# Patient Record
Sex: Female | Born: 1940 | Race: White | Hispanic: No | Marital: Married | Smoking: Never smoker
Health system: Southern US, Community
[De-identification: ages and names within clinical notes are randomized; demographics above are authoritative.]

## PROBLEM LIST (undated history)

## (undated) DIAGNOSIS — F0390 Unspecified dementia without behavioral disturbance: Secondary | ICD-10-CM

---

## 2015-06-25 ENCOUNTER — Emergency Department
Admission: EM | Admit: 2015-06-25 | Discharge: 2015-06-27 | Disposition: A | Discharge status: Home / Self Care | Payer: Medicare Other | Attending: Emergency Medicine | Admitting: Emergency Medicine

## 2015-06-25 DIAGNOSIS — G8929 Other chronic pain: Secondary | ICD-10-CM

## 2015-06-25 DIAGNOSIS — F0392 Unspecified dementia, unspecified severity, with psychotic disturbance: Secondary | ICD-10-CM

## 2015-06-25 DIAGNOSIS — Z79899 Other long term (current) drug therapy: Secondary | ICD-10-CM | POA: Insufficient documentation

## 2015-06-25 DIAGNOSIS — F0391 Unspecified dementia with behavioral disturbance: Secondary | ICD-10-CM | POA: Insufficient documentation

## 2015-06-25 DIAGNOSIS — R4589 Other symptoms and signs involving emotional state: Secondary | ICD-10-CM

## 2015-06-25 LAB — CBC
HEMATOCRIT: 29.1 % — AB (ref 35.0–47.0)
Hemoglobin: 9.8 g/dL — ABNORMAL LOW (ref 12.0–16.0)
MCH: 32.3 pg (ref 26.0–34.0)
MCHC: 33.9 g/dL (ref 32.0–36.0)
MCV: 95.5 fL (ref 80.0–100.0)
Platelets: 337 10*3/uL (ref 150–440)
RBC: 3.04 MIL/uL — ABNORMAL LOW (ref 3.80–5.20)
RDW: 15 % — AB (ref 11.5–14.5)
WBC: 11.2 10*3/uL — ABNORMAL HIGH (ref 3.6–11.0)

## 2015-06-25 LAB — SALICYLATE LEVEL: Salicylate Lvl: <4 mg/dL (ref 2.8–30.0)

## 2015-06-25 LAB — COMPREHENSIVE METABOLIC PANEL
ALBUMIN: 3.8 g/dL (ref 3.5–5.0)
ALK PHOS: 74 U/L (ref 38–126)
ALT: 16 U/L (ref 14–54)
ANION GAP: 4 — AB (ref 5–15)
AST: 23 U/L (ref 15–41)
BILIRUBIN TOTAL: 0.5 mg/dL (ref 0.3–1.2)
BUN: 22 mg/dL — AB (ref 6–20)
CALCIUM: 8.8 mg/dL — AB (ref 8.9–10.3)
CO2: 27 mmol/L (ref 22–32)
CREATININE: 1.18 mg/dL — AB (ref 0.44–1.00)
Chloride: 106 mmol/L (ref 101–111)
GFR calc Af Amer: 51 mL/min — ABNORMAL LOW (ref >60)
GFR calc non Af Amer: 44 mL/min — ABNORMAL LOW (ref >60)
GLUCOSE: 118 mg/dL — AB (ref 65–99)
Potassium: 4.1 mmol/L (ref 3.5–5.1)
Sodium: 137 mmol/L (ref 135–145)
TOTAL PROTEIN: 6.8 g/dL (ref 6.5–8.1)

## 2015-06-25 LAB — ACETAMINOPHEN LEVEL

## 2015-06-25 LAB — ETHANOL: Alcohol, Ethyl (B): <5 mg/dL (ref <5)

## 2015-06-25 MED ORDER — IBUPROFEN 600 MG PO TABS
ORAL_TABLET | ORAL | Status: AC
  Administered 2015-06-25: 600 mg via ORAL
  Filled 2015-06-25: qty 1

## 2015-06-25 MED ORDER — IBUPROFEN 600 MG PO TABS
600.0000 mg | ORAL_TABLET | Freq: Once | ORAL | Status: AC
  Administered 2015-06-25: 600 mg via ORAL
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
## 2015-06-25 NOTE — ED Notes (Addendum)
Black tshirt, beige bra, jeans, black tennis shoes, white socks, white panties; removed and placed in labeled pt belonging bag to be secured on nursing unit; pt dressed in behav scrubs; pt is a retired Engineer, civil (consulting)nurse & pt voices good understanding of plan of care

## 2015-06-26 DIAGNOSIS — R4589 Other symptoms and signs involving emotional state: Secondary | ICD-10-CM

## 2015-06-26 DIAGNOSIS — F0391 Unspecified dementia with behavioral disturbance: Secondary | ICD-10-CM

## 2015-06-26 DIAGNOSIS — F0392 Unspecified dementia, unspecified severity, with psychotic disturbance: Secondary | ICD-10-CM

## 2015-06-26 DIAGNOSIS — G8929 Other chronic pain: Secondary | ICD-10-CM

## 2015-06-26 LAB — URINALYSIS COMPLETE WITH MICROSCOPIC (ARMC ONLY)
Bilirubin Urine: NEGATIVE
GLUCOSE, UA: NEGATIVE mg/dL
HGB URINE DIPSTICK: NEGATIVE
Ketones, ur: NEGATIVE mg/dL
Nitrite: NEGATIVE
PROTEIN: NEGATIVE mg/dL
SPECIFIC GRAVITY, URINE: 1.018 (ref 1.005–1.030)
pH: 5 (ref 5.0–8.0)

## 2015-06-26 LAB — URINE DRUG SCREEN, QUALITATIVE (ARMC ONLY)
Amphetamines, Ur Screen: NOT DETECTED
BARBITURATES, UR SCREEN: NOT DETECTED
Benzodiazepine, Ur Scrn: NOT DETECTED
CANNABINOID 50 NG, UR ~~LOC~~: NOT DETECTED
COCAINE METABOLITE, UR ~~LOC~~: NOT DETECTED
MDMA (Ecstasy)Ur Screen: NOT DETECTED
Methadone Scn, Ur: NOT DETECTED
OPIATE, UR SCREEN: POSITIVE — AB
Phencyclidine (PCP) Ur S: NOT DETECTED
Tricyclic, Ur Screen: POSITIVE — AB

## 2015-06-26 MED ORDER — PANTOPRAZOLE SODIUM 40 MG PO TBEC
40.0000 mg | DELAYED_RELEASE_TABLET | Freq: Every day | ORAL | Status: DC
  Administered 2015-06-26 – 2015-06-27 (×2): 40 mg via ORAL
  Filled 2015-06-26 (×2): qty 1

## 2015-06-26 MED ORDER — CALCIUM CARBONATE-VITAMIN D 500-200 MG-UNIT PO TABS
1.0000 | ORAL_TABLET | ORAL | Status: DC
  Administered 2015-06-27: 1 via ORAL
  Filled 2015-06-26 (×2): qty 1

## 2015-06-26 MED ORDER — ACETAMINOPHEN 325 MG PO TABS
650.0000 mg | ORAL_TABLET | Freq: Once | ORAL | Status: AC
  Administered 2015-06-26: 650 mg via ORAL
  Filled 2015-06-26: qty 2

## 2015-06-26 MED ORDER — HYDROMORPHONE HCL 2 MG PO TABS
4.0000 mg | ORAL_TABLET | Freq: Four times a day (QID) | ORAL | Status: DC | PRN
  Administered 2015-06-27 (×2): 4 mg via ORAL
  Filled 2015-06-26 (×3): qty 2

## 2015-06-26 MED ORDER — WARFARIN - PHYSICIAN DOSING INPATIENT
Freq: Every day | Status: DC
  Filled 2015-06-26 (×6): qty 1

## 2015-06-26 MED ORDER — VITAMIN D 1000 UNITS PO TABS
5000.0000 [IU] | ORAL_TABLET | Freq: Every day | ORAL | Status: DC
  Administered 2015-06-26 – 2015-06-27 (×2): 5000 [IU] via ORAL
  Filled 2015-06-26 (×2): qty 5

## 2015-06-26 MED ORDER — METOPROLOL SUCCINATE ER 50 MG PO TB24
50.0000 mg | ORAL_TABLET | ORAL | Status: DC
  Administered 2015-06-27: 50 mg via ORAL
  Filled 2015-06-26: qty 1

## 2015-06-26 MED ORDER — WARFARIN SODIUM 10 MG PO TABS
10.0000 mg | ORAL_TABLET | ORAL | Status: DC
  Filled 2015-06-26: qty 1

## 2015-06-26 MED ORDER — ROPINIROLE HCL 1 MG PO TABS
1.0000 mg | ORAL_TABLET | Freq: Two times a day (BID) | ORAL | Status: DC
  Administered 2015-06-26 – 2015-06-27 (×2): 1 mg via ORAL
  Filled 2015-06-26 (×3): qty 1

## 2015-06-26 MED ORDER — METHIMAZOLE 5 MG PO TABS
7.5000 mg | ORAL_TABLET | Freq: Every day | ORAL | Status: DC
  Administered 2015-06-26 – 2015-06-27 (×2): 7.5 mg via ORAL
  Filled 2015-06-26 (×2): qty 2

## 2015-06-26 MED ORDER — AMLODIPINE BESYLATE 5 MG PO TABS
5.0000 mg | ORAL_TABLET | Freq: Every day | ORAL | Status: DC
  Administered 2015-06-26 – 2015-06-27 (×2): 5 mg via ORAL
  Filled 2015-06-26 (×2): qty 1

## 2015-06-26 MED ORDER — TRAZODONE HCL 50 MG PO TABS
50.0000 mg | ORAL_TABLET | Freq: Every day | ORAL | Status: DC
  Administered 2015-06-26: 50 mg via ORAL
  Filled 2015-06-26: qty 1

## 2015-06-26 MED ORDER — SPIRONOLACTONE 25 MG PO TABS
50.0000 mg | ORAL_TABLET | Freq: Every day | ORAL | Status: DC
  Administered 2015-06-26 – 2015-06-27 (×2): 50 mg via ORAL
  Filled 2015-06-26 (×2): qty 2

## 2015-06-26 MED ORDER — SENNOSIDES-DOCUSATE SODIUM 8.6-50 MG PO TABS
1.0000 | ORAL_TABLET | Freq: Three times a day (TID) | ORAL | Status: DC
Start: 2015-06-26 — End: 2015-06-27
  Administered 2015-06-26 – 2015-06-27 (×2): 1 via ORAL
  Filled 2015-06-26 (×2): qty 1

## 2015-06-26 MED ORDER — WARFARIN SODIUM 5 MG PO TABS
5.0000 mg | ORAL_TABLET | Freq: Every day | ORAL | Status: DC
  Administered 2015-06-26: 5 mg via ORAL
  Filled 2015-06-26 (×2): qty 1

## 2015-06-26 MED ORDER — WARFARIN SODIUM 2 MG PO TABS
2.0000 mg | ORAL_TABLET | Freq: Every day | ORAL | Status: DC
  Administered 2015-06-26: 2 mg via ORAL
  Filled 2015-06-26 (×2): qty 1

## 2015-06-26 NOTE — ED Provider Notes (Signed)
-----------------------------------------   7:52 AM on 06/26/2015 -----------------------------------------   Blood pressure 158/111, pulse 85, temperature 98.2 F (36.8 C), temperature source Oral, resp. rate 20, height 5\' 3"  (1.6 m), weight 180 lb (81.647 kg), SpO2 97 %.  The patient had no acute events since last update.  Calm and cooperative at this time.  Disposition is pending per Psychiatry/Behavioral Medicine team recommendations.     Jennye MoccasinBrian S Quigley, MD 06/26/15 1213

## 2015-06-26 NOTE — ED Notes (Signed)
Patient in bathroom at this time.

## 2015-06-26 NOTE — ED Notes (Signed)
Pt skin tear to right forearm while on toilet; wrapped and covered with gauze. Bilateral protection to forearms.

## 2015-06-26 NOTE — ED Notes (Signed)
I have observed walk to and from the BR  NAD assessed  I can hear her telling the tech  "I walk just fine - you do not have to help me."     Tech provided walker to her at this time  - pt ambulates well with the walker

## 2015-06-26 NOTE — ED Notes (Signed)
ED BHU PLACEMENT JUSTIFICATION Is the patient under IVC or is there intent for IVC: Yes.   Is the patient medically cleared: Yes.   Is there vacancy in the ED BHU: Yes.   Is the population mix appropriate for patient:  She is using a walker here  Is the patient awaiting placement in inpatient or outpatient setting:   Has the patient had a psychiatric consult:   Consult pending Survey of unit performed for contraband, proper placement and condition of furniture, tampering with fixtures in bathroom, shower, and each patient room: Yes.  ; Findings:  APPEARANCE/BEHAVIOR Calm and cooperative NEURO ASSESSMENT Orientation: oriented to self,  place and situation   Denies pain Hallucinations: No.None noted (Hallucinations) Speech: Normal Gait:  Steady  RESPIRATORY ASSESSMENT Even  Unlabored respirations  CARDIOVASCULAR ASSESSMENT Pulses equal   regular rate  Skin warm and dry   GASTROINTESTINAL ASSESSMENT no GI complaint EXTREMITIES Full ROM  PLAN OF CARE Provide calm/safe environment. Vital signs assessed twice daily. ED BHU Assessment once each 12-hour shift. Collaborate with intake RN daily or as condition indicates. Assure the ED provider has rounded once each shift. Provide and encourage hygiene. Provide redirection as needed. Assess for escalating behavior; address immediately and inform ED provider.  Assess family dynamic and appropriateness for visitation as needed: Yes.  ; If necessary, describe findings:  Educate the patient/family about BHU procedures/visitation: Yes.  ; If necessary, describe findings:

## 2015-06-26 NOTE — ED Notes (Signed)
BEHAVIORAL HEALTH ROUNDING Patient sleeping: No. Patient alert and oriented: yes Behavior appropriate: Yes.  ; If no, describe:  Nutrition and fluids offered: yes Toileting and hygiene offered: Yes  Sitter present: q15 minute observations and security  monitoring Law enforcement present: Yes  ODS  
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
## 2015-06-26 NOTE — ED Notes (Signed)
Pt verbalized to staff that she was unable to consume food without her dentures. Staff found and labeled dentures and gave them to patient. Pt, then expressed that she was no longer hungry since she did not want any of our food options. Pt has been given her eye glasses in addition to her eye glasses case to keep for her stay in her room/ RN notified/   Pt has total (3) eye glasses/ (3) eye glasses cases / and 1 set of dentures (top and bottom). Pt has 1 pair of glasses and glasses case in her possession in her room: in addition to her denture set mentioned above. The other glasses and glasses cases have been labeled and placed in her belongings bag for further keeping.

## 2015-06-26 NOTE — ED Notes (Signed)
Pt ate 20% of dinner tray; states that she is not hungry.

## 2015-06-26 NOTE — ED Notes (Signed)
Pt assisted to bathroom with 1 person assist and walker.

## 2015-06-26 NOTE — ED Notes (Signed)
BEHAVIORAL HEALTH ROUNDING  Patient sleeping: Yes Patient alert and oriented: Sleeping Behavior appropriate: Yes. ; If no, describe:  Nutrition and fluids offered: No, sleeping  Toileting and hygiene offered: No, sleeping  Sitter present: q15 minute observations and security monitoring  Law enforcement present: Yes ODS 

## 2015-06-26 NOTE — ED Notes (Addendum)

## 2015-06-26 NOTE — ED Notes (Signed)
BEHAVIORAL HEALTH ROUNDING Patient sleeping: Yes.   Patient alert and oriented: eyes closed  Appears to be asleep Behavior appropriate: Yes.  ; If no, describe:  Nutrition and fluids offered: sleeping Toileting and hygiene offered: sleeping Sitter present: q 15 minute observations and security monitoring Law enforcement present: yes  ODS 
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
## 2015-06-26 NOTE — Consult Note (Signed)
[facility] Face-to-Face Psychiatry Consult   Reason for Consult:  Consult for this 75 year old woman with a history of multiple medical problems and dementia. Referred under IVC because of threatening behavior associated with delusions Referring Physician:  Marcelene Butte Patient Identification: Kristin Roy MRN:  [id number] Principal Diagnosis: Dementia with psychosis Diagnosis:   Patient Active Problem List   Diagnosis Date Noted  . 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 Threatening to others [R45.89] 06/26/2015  . Chronic pain [G89.29] 06/26/2015    Total Time spent with patient: 1 hour  Subjective:   Sawyer Kahan is a 75 y.o. female patient admitted with "my sons just do what her father says"  HPI:  Patient seen and interviewed. Chart reviewed. Intake note reviewed. Commitment paper reviewed. Labs reviewed. Took a look also at her collateral history from [place]. 75 year old woman brought in by her family because of concerns that she has been making threats. They report that ever since she came home from rehabilitation a few weeks ago she has been getting increasingly agitated and hostile. She has developed a delusion that her husband is having an affair with an 50 year old caregiver. They also allege that she has had hallucinations. They alleged that she is refusing medicine and now is making threats of violence against her husband. It's reported that she said that she was going to shoot him at one point. On interview today the patient readily tells me that she believes that her husband is having an affair with an 46 year old caregiver. She denies however having any homicidal ideation. She says that she has been irritable and angry but denies having made any threats of violence. She says that the gun that she keeps in the house she has already given up to her husband and she doesn't have access to any other guns. Patient says her mood has been irritable and blames it on this allege and situation. She  is sleeping poorly. Appetite is a little bit worse than usual. Her pain has been bothering her quite a bit. Patient is not aware and won't admit to having any auditory or visual hallucinations. Denies any suicidality. Denies any abuse of drugs or alcohol. She is on multiple medications for her multiple medical problems including a modest dose of hydrocodone daily for her chronic pain. It's not clear to me that anything has really changed much with her medicines recently.  Social history: Lives at home with her husband. She has adult children I believe 2 sons in the area. Sons evidently also believe that she has been delusional although I haven't spoken to them yet. 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  Medical history: Multiple medical problems including chronic pain from arthritis. Not clear to me if she's been diagnosed with rheumatoid arthritis but it does look like she has some disfigurement of her hands.  Substance abuse history: Denies any alcohol use or drug abuse current or past.  Past Psychiatric History: Patient denies ever seeing a psychiatrist therapist or mental health provider in the past. She is not aware of ever being prescribed any psychiatric medicine. It does look like Aricept has been prescribed for her by her primary care doctor. She denies being aware of it. Denies any history of suicidal behavior or violence or psychiatric hospitalization.  Risk to Self: Suicidal Ideation: No Suicidal Intent: No Is patient at risk for suicide?: No Suicidal Plan?: No Access to Means: No What has been your use of drugs/alcohol within the last 12  months?: None reported How many times?: 0 Other Self Harm Risks: None reported Triggers for Past Attempts: None known Intentional Self Injurious Behavior: None Risk to Others: Homicidal Ideation: Yes-Currently Present Thoughts of Harm to Others: Yes-Currently Present Comment - Thoughts of Harm to Others:  IVC paperwork states that patient voiced thoughts of shooting husband. Current Homicidal Intent: No Current Homicidal Plan: No Access to Homicidal Means: No Identified Victim: Patient's husband History of harm to others?: No Assessment of Violence: None Noted Violent Behavior Description: None identified Does patient have access to weapons?: No Criminal Charges Pending?: No Does patient have a court date: No Prior Inpatient Therapy: Prior Inpatient Therapy: No Prior Therapy Dates: N/A Prior Therapy Facilty/Provider(s): N/a Reason for Treatment: N/A Prior Outpatient Therapy: Prior Outpatient Therapy: No Prior Therapy Dates: N/A Prior Therapy Facilty/Provider(s): N/a Reason for Treatment: N/A Does patient have an ACCT team?: No Does patient have Intensive In-House Services?  : No Does patient have [facility] services? : No Does patient have P4CC services?: No  Past Medical History: No past medical history on file. No past surgical history on file. Family History: No family history on file. Family Psychiatric  History: Patient denies any family history of mental illness or substance abuse problems Social History:  History  Alcohol Use: Not on file     History  Drug Use Not on file    Social History   Social History  . Marital Status: Married    Spouse Name: N/A  . Number of Children: N/A  . Years of Education: N/A   Social History Main Topics  . Smoking status: Not on file  . Smokeless tobacco: Not on file  . Alcohol Use: Not on file  . Drug Use: Not on file  . Sexual Activity: Not on file   Other Topics Concern  . Not on file   Social History Narrative  . No narrative on file   Additional Social History:    Allergies:   Allergies  Allergen Reactions  . Cephalosporins Shortness Of Breath    Other Reaction: HIVES/SOB  . Hydroxychloroquine Anaphylaxis    Difficulty breathing, tightness in chest  Pt states she felt like she was dying  . Methotrexate Anaphylaxis   . Codeine Nausea Only  . Doxycycline Rash  . Pregabalin Other (See Comments)    Patient states medication makes her crazy  . Donepezil Other (See Comments)    Reaction: unknown  . Other Other (See Comments)    Reactive agent: Tetanus and Diphtheria toxoid Reaction: Severe malaise  . Sulfamethoxazole-Trimethoprim Hives    Labs:  Results for orders placed or performed during the hospital encounter of 06/25/15 (from the past 48 hour(s))  Urinalysis complete, with microscopic ([facility] only)     Status: Abnormal   Collection Time: 06/25/15  1:50 AM  Result Value Ref Range   Color, Urine YELLOW (A) YELLOW   APPearance HAZY (A) CLEAR   Glucose, UA NEGATIVE NEGATIVE mg/dL   Bilirubin Urine NEGATIVE NEGATIVE   Ketones, ur NEGATIVE NEGATIVE mg/dL   Specific Gravity, Urine 1.018 1.005 - 1.030   Hgb urine dipstick NEGATIVE NEGATIVE   pH 5.0 5.0 - 8.0   Protein, ur NEGATIVE NEGATIVE mg/dL   Nitrite NEGATIVE NEGATIVE   Leukocytes, UA 2+ (A) NEGATIVE   RBC / HPF 0-5 0 - 5 RBC/hpf   WBC, UA 6-30 0 - 5 WBC/hpf   Bacteria, UA RARE (A) NONE SEEN   Squamous Epithelial / LPF 0-5 (A) NONE SEEN  Mucous PRESENT   Comprehensive metabolic panel     Status: Abnormal   Collection Time: 06/25/15 10:06 PM  Result Value Ref Range   Sodium 137 135 - 145 mmol/L   Potassium 4.1 3.5 - 5.1 mmol/L   Chloride 106 101 - 111 mmol/L   CO2 27 22 - 32 mmol/L   Glucose, Bld 118 (H) 65 - 99 mg/dL   BUN 22 (H) 6 - 20 mg/dL   Creatinine, Ser 1.18 (H) 0.44 - 1.00 mg/dL   Calcium 8.8 (L) 8.9 - 10.3 mg/dL   Total Protein 6.8 6.5 - 8.1 g/dL   Albumin 3.8 3.5 - 5.0 g/dL   AST 23 15 - 41 U/L   ALT 16 14 - 54 U/L   Alkaline Phosphatase 74 38 - 126 U/L   Total Bilirubin 0.5 0.3 - 1.2 mg/dL   GFR calc non Af Amer 44 (L) >60 mL/min   GFR calc Af Amer 51 (L) >60 mL/min    Comment: (NOTE) The eGFR has been calculated using the CKD EPI equation. This calculation has not been validated in all clinical situations. eGFR's  persistently <60 mL/min signify possible Chronic Kidney Disease.    Anion gap 4 (L) 5 - 15  Ethanol     Status: None   Collection Time: 06/25/15 10:06 PM  Result Value Ref Range   Alcohol, Ethyl (B) <5 <5 mg/dL    Comment:        LOWEST DETECTABLE LIMIT FOR SERUM ALCOHOL IS 5 mg/dL FOR MEDICAL PURPOSES ONLY   Salicylate level     Status: None   Collection Time: 06/25/15 10:06 PM  Result Value Ref Range   Salicylate Lvl <6.7 2.8 - 30.0 mg/dL  Acetaminophen level     Status: Abnormal   Collection Time: 06/25/15 10:06 PM  Result Value Ref Range   Acetaminophen (Tylenol), Serum <10 (L) 10 - 30 ug/mL    Comment:        THERAPEUTIC CONCENTRATIONS VARY SIGNIFICANTLY. A RANGE OF 10-30 ug/mL MAY BE AN EFFECTIVE CONCENTRATION FOR MANY PATIENTS. HOWEVER, SOME ARE BEST TREATED AT CONCENTRATIONS OUTSIDE THIS RANGE. ACETAMINOPHEN CONCENTRATIONS >150 ug/mL AT 4 HOURS AFTER INGESTION AND >50 ug/mL AT 12 HOURS AFTER INGESTION ARE OFTEN ASSOCIATED WITH TOXIC REACTIONS.   cbc     Status: Abnormal   Collection Time: 06/25/15 10:06 PM  Result Value Ref Range   WBC 11.2 (H) 3.6 - 11.0 K/uL   RBC 3.04 (L) 3.80 - 5.20 MIL/uL   Hemoglobin 9.8 (L) 12.0 - 16.0 g/dL   HCT 29.1 (L) 35.0 - 47.0 %   MCV 95.5 80.0 - 100.0 fL   MCH 32.3 26.0 - 34.0 pg   MCHC 33.9 32.0 - 36.0 g/dL   RDW 15.0 (H) 11.5 - 14.5 %   Platelets 337 150 - 440 K/uL  Urine Drug Screen, Qualitative     Status: Abnormal   Collection Time: 06/26/15  1:50 AM  Result Value Ref Range   Tricyclic, Ur Screen POSITIVE (A) NONE DETECTED   Amphetamines, Ur Screen NONE DETECTED NONE DETECTED   MDMA (Ecstasy)Ur Screen NONE DETECTED NONE DETECTED   Cocaine Metabolite,Ur [place] NONE DETECTED NONE DETECTED   Opiate, Ur Screen POSITIVE (A) NONE DETECTED   Phencyclidine (PCP) Ur S NONE DETECTED NONE DETECTED   Cannabinoid 50 Ng, Ur [place] NONE DETECTED NONE DETECTED   Barbiturates, Ur Screen NONE DETECTED NONE DETECTED   Benzodiazepine, Ur  Scrn NONE DETECTED NONE DETECTED   Methadone Scn, Ur  NONE DETECTED NONE DETECTED    Comment: (NOTE) 127  Tricyclics, urine               Cutoff 1000 ng/mL 200  Amphetamines, urine             Cutoff 1000 ng/mL 300  MDMA (Ecstasy), urine           Cutoff 500 ng/mL 400  Cocaine Metabolite, urine       Cutoff 300 ng/mL 500  Opiate, urine                   Cutoff 300 ng/mL 600  Phencyclidine (PCP), urine      Cutoff 25 ng/mL 700  Cannabinoid, urine              Cutoff 50 ng/mL 800  Barbiturates, urine             Cutoff 200 ng/mL 900  Benzodiazepine, urine           Cutoff 200 ng/mL 1000 Methadone, urine                Cutoff 300 ng/mL 1100 1200 The urine drug screen provides only a preliminary, unconfirmed 1300 analytical test result and should not be used for non-medical 1400 purposes. Clinical consideration and professional judgment should 1500 be applied to any positive drug screen result due to possible 1600 interfering substances. A more specific alternate chemical method 1700 must be used in order to obtain a confirmed analytical result.  1800 Gas chromato graphy / mass spectrometry (GC/MS) is the preferred 1900 confirmatory method.     No current facility-administered medications for this encounter.   No current outpatient prescriptions on file.    Musculoskeletal: Strength & Muscle Tone: decreased Gait & Station: unsteady Patient leans: N/A  Psychiatric Specialty Exam: Review of Systems  Constitutional: Negative.   HENT: Negative.   Eyes: Negative.   Respiratory: Negative.   Cardiovascular: Negative.   Gastrointestinal: Negative.   Musculoskeletal: Positive for back pain and joint pain.  Skin: Negative.   Neurological: Negative.   Psychiatric/Behavioral: Positive for memory loss. Negative for depression, suicidal ideas, hallucinations and substance abuse. The patient is nervous/anxious and has insomnia.     Blood pressure 158/111, pulse 85, temperature 98.2 F  (36.8 C), temperature source Oral, resp. rate 20, height 5' 3"  (1.6 m), weight 81.647 kg (180 lb), SpO2 97 %.Body mass index is 31.89 kg/(m^2).  General Appearance: Disheveled  Eye Contact::  Fair  Speech:  Garbled  Volume:  Decreased  Mood:  Irritable  Affect:  Constricted  Thought Process:  Tangential  Orientation:  Other:  To my surprise the patient could not tell me where she was at all nor could she give me any answers to the time.  Thought Content:  Paranoid Ideation  Suicidal Thoughts:  No  Homicidal Thoughts:  Patient is denying this although it is what his alleged in the commitment paperwork  Memory:  Immediate;   Good Recent;   Poor Remote;   Poor  Judgement:  Impaired  Insight:  Lacking  Psychomotor Activity:  Decreased  Concentration:  Fair  Recall:  Poor  Fund of Knowledge:Fair  Language: Fair  Akathisia:  No  Handed:  Right  AIMS (if indicated):     Assets:  Housing Social Support  ADL's:  Impaired  Cognition: Impaired,  Mild and Moderate  Sleep:      Treatment Plan Summary: Daily contact with patient to assess and evaluate symptoms  and progress in treatment, Medication management and Plan 75 year old woman who clearly is demented. I did a Mini-Mental status exam and she scored 16 at most. Patient is having delusions. Although of course I can't prove it one way or the other this is a very typical standard delusion if the whole family believes that she is delusional I think it's most likely the case. Family appears concerned enough that they feel frightened of her threats. Patient is not to be talked out of it. I don't see any clear medical cause for it specifically. I am going to suggest we refer her to a geriatric psychiatry ward. Continue current medicines for now. Start a very low dose of risperidone.  Disposition: Recommend psychiatric Inpatient admission when medically cleared. Supportive therapy provided about ongoing stressors.  Alethia Berthold, MD 06/26/2015  1:33 PM

## 2015-06-26 NOTE — ED Notes (Signed)
Pt crying with pain. RN notified pt requested something for pain.

## 2015-06-26 NOTE — ED Notes (Signed)
BEHAVIORAL HEALTH ROUNDING Patient sleeping: Yes.   Patient alert and oriented: eyes closed  Appears to be asleep Behavior appropriate: Yes.  ; If no, describe:  Nutrition and fluids offered:  sleeping Toileting and hygiene offered: sleeping Sitter present: q 15 minute observations and security camera monitoring Law enforcement present: yes  ODS 

## 2015-06-26 NOTE — ED Notes (Signed)
Md ordered home meds.

## 2015-06-26 NOTE — ED Notes (Signed)
Skin tear on left arm bleeding, pt thinks she scratched it while sleeping. Wound cleaned and dressing put on it. Put plain Tegaderm over other skin tear on left arm that is scabbed over but not bleeding so that pt cannot scratch it inadvertently while sleeping. Blankets with blood on them changed. Pt voicing no needs or complaints at this time. Will continue to monitor.

## 2015-06-26 NOTE — Progress Notes (Signed)
Referral information for Geriatric Placement have been faxed to;     [facility]([phone]), F:[phone]   [facility]([phone]),    [phone]),    [facility]([phone]),   06/26/2015 Cheryl FlashNicole Kuba Shepherd, MS, NCC, LPCA Therapeutic Triage Specialist

## 2015-06-26 NOTE — ED Notes (Signed)
Meal provided    Received an update from TTS - pt is being referred out to geripsych placement

## 2015-06-27 ENCOUNTER — Emergency Department: Payer: Medicare Other

## 2015-06-27 DIAGNOSIS — F0391 Unspecified dementia with behavioral disturbance: Secondary | ICD-10-CM | POA: Diagnosis not present

## 2015-06-27 LAB — PROTIME-INR
INR: 2.06
PROTHROMBIN TIME: 23.1 s — AB (ref 11.4–15.0)

## 2015-06-27 MED ORDER — WARFARIN SODIUM 5 MG PO TABS: 5.0000 mg | ORAL_TABLET | ORAL | Status: DC

## 2015-06-27 MED ORDER — WARFARIN SODIUM 2 MG PO TABS: 2.0000 mg | ORAL_TABLET | ORAL | Status: DC

## 2015-06-27 NOTE — BH Assessment (Signed)
Requested Information(EKG, Chest X-Ray and medication list) faxed to;   [facility] [phone]([phone])   [facility] [phone]([phone]

## 2015-06-27 NOTE — ED Notes (Signed)
Pt up to use bedside commode; urinated only.

## 2015-06-27 NOTE — Progress Notes (Signed)
LCSW spoke to ED nurse and she will put in an order for EKG and chest x-ray.  Kenetra Hildenbrand LCSW

## 2015-06-27 NOTE — ED Provider Notes (Signed)
-----------------------------------------   6:26 AM on 06/27/2015 -----------------------------------------   Blood pressure 162/106, pulse 112, temperature 98.8 F (37.1 C), temperature source Oral, resp. rate 19, height 5\' 3"  (1.6 m), weight 180 lb (81.647 kg), SpO2 98 %.  The patient had no acute events since last update.  Calm and cooperative at this time.  Disposition is pending per Psychiatry/Behavioral Medicine team recommendations. Plan for inpatient geriatric psychiatry care, pending placement.     Sharman CheekPhillip Sentoria Brent, MD 06/27/15 [phone]

## 2015-06-27 NOTE — ED Provider Notes (Signed)
-----------------------------------------   11:02 AM on 06/27/2015 -----------------------------------------   Blood pressure 162/106, pulse 112, temperature 98.8 F (37.1 C), temperature source Oral, resp. rate 19, height 5\' 3"  (1.6 m), weight 180 lb (81.647 kg), SpO2 98 %.  The patient had no acute events since last update.  Calm and cooperative at this time.  Disposition is pending per Psychiatry/Behavioral Medicine team recommendations.   Patient had an EKG and chest x-ray at the request of the receiving facility  ED ECG REPORT I, Jennye MoccasinBrian S Xolani Degracia, the attending physician, personally viewed and interpreted this ECG.  Date: 06/27/2015 EKG Time: 1053 Rate: 108 Rhythm: normal sinus rhythm QRS Axis: normal Intervals: normal ST/T Wave abnormalities: normal Conduction Disturbances: none Narrative Interpretation: unremarkable No acute ischemic changes   Final result by Rad Results In Interface (06/27/15 10:48:20)   Narrative:   CLINICAL DATA: Dementia with chest pain  EXAM: CHEST 2 VIEW  COMPARISON: None.  FINDINGS: Lungs are clear. Heart size and pulmonary vascularity are normal. No adenopathy. There is atherosclerotic calcification in the aorta. There are anterior wedge compression fractures at T12 and L1. Patient is undergone kyphoplasty procedure at L1.  IMPRESSION: No edema or consolidation.   Electronically Signed By: Bretta BangWilliam Woodruff III M.D. On: 06/27/2015 10:48      Patient's clinical status is otherwise unchanged  Jennye MoccasinBrian S Waynesha Rammel, MD 06/27/15 1103

## 2015-06-27 NOTE — ED Notes (Signed)
Lunch tray given. 

## 2015-06-27 NOTE — ED Notes (Signed)
Pt sitting up in recliner at this time. Pt doesn't specify any specific needs at this time.

## 2015-06-27 NOTE — BH Assessment (Addendum)
Patient has been accepted to [facility].  Accepting physician is Dr. Lowanda FosterBeverly Jones.  Call report to [phone].  Representative was [employer].  ER Staff is aware of it Rivka Barbara(Glenda, ER Sect.; Dr. Huel CoteQuigley, ER MD & Erskine SquibbJane, Patient's Nurse) Patient's husband (W. G. [phone]) have been updated as well.

## 2015-06-27 NOTE — ED Notes (Signed)
Pt informed she is transferring to [facility]. [facility].

## 2015-06-27 NOTE — ED Notes (Signed)
Pt up using bedside toilet with only walker assistance.

## 2015-06-27 NOTE — Consult Note (Signed)
[facility] Face-to-Face Psychiatry Consult   Reason for Consult:  Consult for this 75 year old woman with a history of multiple medical problems and dementia. Referred under IVC because of threatening behavior associated with delusions Referring Physician:  Marcelene Butte Patient Identification: Kristin Roy MRN:  [id number] Principal Diagnosis: Dementia with psychosis Diagnosis:   Patient Active Problem List   Diagnosis Date Noted  . 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 Threatening to others [R45.89] 06/26/2015  . Chronic pain [G89.29] 06/26/2015    Total Time spent with patient: 20 minutes  Subjective:   Kristin Roy is a 75 y.o. female patient admitted with "my sons just do what her father says"   Follow-up note Friday the 12th for this 75 year old woman with dementia with psychosis. Patient has no new complaints today. She has largely been cooperative with treatment here. Patient has not made any direct threats. She continues to hold onto her delusional belief but despite that was able to visit with her family without difficulty. Medically he appears stable. Vital signs normal. She is able to ambulate with the help of her walker. We have now gotten approval for admission to [facility] for her.  HPI:  Patient seen and interviewed. Chart reviewed. Intake note reviewed. Commitment paper reviewed. Labs reviewed. Took a look also at her collateral history from [place]. 75 year old woman brought in by her family because of concerns that she has been making threats. They report that ever since she came home from rehabilitation a few weeks ago she has been getting increasingly agitated and hostile. She has developed a delusion that her husband is having an affair with an 16 year old caregiver. They also allege that she has had hallucinations. They alleged that she is refusing medicine and now is making threats of violence against her husband. It's reported that she said that she was going to shoot him at  one point. On interview today the patient readily tells me that she believes that her husband is having an affair with an 58 year old caregiver. She denies however having any homicidal ideation. She says that she has been irritable and angry but denies having made any threats of violence. She says that the gun that she keeps in the house she has already given up to her husband and she doesn't have access to any other guns. Patient says her mood has been irritable and blames it on this allege and situation. She is sleeping poorly. Appetite is a little bit worse than usual. Her pain has been bothering her quite a bit. Patient is not aware and won't admit to having any auditory or visual hallucinations. Denies any suicidality. Denies any abuse of drugs or alcohol. She is on multiple medications for her multiple medical problems including a modest dose of hydrocodone daily for her chronic pain. It's not clear to me that anything has really changed much with her medicines recently.  Social history: Lives at home with her husband. She has adult children I believe 2 sons in the area. Sons evidently also believe that she has been delusional although I haven't spoken to them yet. 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  Medical history: Multiple medical problems including chronic pain from arthritis. Not clear to me if she's been diagnosed with rheumatoid arthritis but it does look like she has some disfigurement of her hands.  Substance abuse history: Denies any alcohol use or drug abuse current or past.  Past Psychiatric History: Patient denies ever seeing a psychiatrist therapist  or mental health provider in the past. She is not aware of ever being prescribed any psychiatric medicine. It does look like Aricept has been prescribed for her by her primary care doctor. She denies being aware of it. Denies any history of suicidal behavior or violence or psychiatric  hospitalization.  Risk to Self: Suicidal Ideation: No Suicidal Intent: No Is patient at risk for suicide?: No Suicidal Plan?: No Access to Means: No What has been your use of drugs/alcohol within the last 12 months?: None reported How many times?: 0 Other Self Harm Risks: None reported Triggers for Past Attempts: None known Intentional Self Injurious Behavior: None Risk to Others: Homicidal Ideation: Yes-Currently Present Thoughts of Harm to Others: Yes-Currently Present Comment - Thoughts of Harm to Others: IVC paperwork states that patient voiced thoughts of shooting husband. Current Homicidal Intent: No Current Homicidal Plan: No Access to Homicidal Means: No Identified Victim: Patient's husband History of harm to others?: No Assessment of Violence: None Noted Violent Behavior Description: None identified Does patient have access to weapons?: No Criminal Charges Pending?: No Does patient have a court date: No Prior Inpatient Therapy: Prior Inpatient Therapy: No Prior Therapy Dates: N/A Prior Therapy Facilty/Provider(s): N/a Reason for Treatment: N/A Prior Outpatient Therapy: Prior Outpatient Therapy: No Prior Therapy Dates: N/A Prior Therapy Facilty/Provider(s): N/a Reason for Treatment: N/A Does patient have an ACCT team?: No Does patient have Intensive In-House Services?  : No Does patient have [facility] services? : No Does patient have P4CC services?: No  Past Medical History: No past medical history on file. No past surgical history on file. Family History: No family history on file. Family Psychiatric  History: Patient denies any family history of mental illness or substance abuse problems Social History:  History  Alcohol Use: Not on file     History  Drug Use Not on file    Social History   Social History  . Marital Status: Married    Spouse Name: N/A  . Number of Children: N/A  . Years of Education: N/A   Social History Main Topics  . Smoking status:  Not on file  . Smokeless tobacco: Not on file  . Alcohol Use: Not on file  . Drug Use: Not on file  . Sexual Activity: Not on file   Other Topics Concern  . Not on file   Social History Narrative  . No narrative on file   Additional Social History:    Allergies:   Allergies  Allergen Reactions  . Cephalosporins Shortness Of Breath    Other Reaction: HIVES/SOB  . Hydroxychloroquine Anaphylaxis    Difficulty breathing, tightness in chest  Pt states she felt like she was dying  . Methotrexate Anaphylaxis  . Codeine Nausea Only  . Doxycycline Rash  . Pregabalin Other (See Comments)    Patient states medication makes her crazy  . Donepezil Other (See Comments)    Reaction: unknown  . Other Other (See Comments)    Reactive agent: Tetanus and Diphtheria toxoid Reaction: Severe malaise  . Sulfamethoxazole-Trimethoprim Hives    Labs:  Results for orders placed or performed during the hospital encounter of 06/25/15 (from the past 48 hour(s))  Comprehensive metabolic panel     Status: Abnormal   Collection Time: 06/25/15 10:06 PM  Result Value Ref Range   Sodium 137 135 - 145 mmol/L   Potassium 4.1 3.5 - 5.1 mmol/L   Chloride 106 101 - 111 mmol/L   CO2 27 22 - 32 mmol/L  Glucose, Bld 118 (H) 65 - 99 mg/dL   BUN 22 (H) 6 - 20 mg/dL   Creatinine, Ser 1.18 (H) 0.44 - 1.00 mg/dL   Calcium 8.8 (L) 8.9 - 10.3 mg/dL   Total Protein 6.8 6.5 - 8.1 g/dL   Albumin 3.8 3.5 - 5.0 g/dL   AST 23 15 - 41 U/L   ALT 16 14 - 54 U/L   Alkaline Phosphatase 74 38 - 126 U/L   Total Bilirubin 0.5 0.3 - 1.2 mg/dL   GFR calc non Af Amer 44 (L) >60 mL/min   GFR calc Af Amer 51 (L) >60 mL/min    Comment: (NOTE) The eGFR has been calculated using the CKD EPI equation. This calculation has not been validated in all clinical situations. eGFR's persistently <60 mL/min signify possible Chronic Kidney Disease.    Anion gap 4 (L) 5 - 15  Ethanol     Status: None   Collection Time: 06/25/15 10:06  PM  Result Value Ref Range   Alcohol, Ethyl (B) <5 <5 mg/dL    Comment:        LOWEST DETECTABLE LIMIT FOR SERUM ALCOHOL IS 5 mg/dL FOR MEDICAL PURPOSES ONLY   Salicylate level     Status: None   Collection Time: 06/25/15 10:06 PM  Result Value Ref Range   Salicylate Lvl <0.9 2.8 - 30.0 mg/dL  Acetaminophen level     Status: Abnormal   Collection Time: 06/25/15 10:06 PM  Result Value Ref Range   Acetaminophen (Tylenol), Serum <10 (L) 10 - 30 ug/mL    Comment:        THERAPEUTIC CONCENTRATIONS VARY SIGNIFICANTLY. A RANGE OF 10-30 ug/mL MAY BE AN EFFECTIVE CONCENTRATION FOR MANY PATIENTS. HOWEVER, SOME ARE BEST TREATED AT CONCENTRATIONS OUTSIDE THIS RANGE. ACETAMINOPHEN CONCENTRATIONS >150 ug/mL AT 4 HOURS AFTER INGESTION AND >50 ug/mL AT 12 HOURS AFTER INGESTION ARE OFTEN ASSOCIATED WITH TOXIC REACTIONS.   cbc     Status: Abnormal   Collection Time: 06/25/15 10:06 PM  Result Value Ref Range   WBC 11.2 (H) 3.6 - 11.0 K/uL   RBC 3.04 (L) 3.80 - 5.20 MIL/uL   Hemoglobin 9.8 (L) 12.0 - 16.0 g/dL   HCT 29.1 (L) 35.0 - 47.0 %   MCV 95.5 80.0 - 100.0 fL   MCH 32.3 26.0 - 34.0 pg   MCHC 33.9 32.0 - 36.0 g/dL   RDW 15.0 (H) 11.5 - 14.5 %   Platelets 337 150 - 440 K/uL  Urine Drug Screen, Qualitative     Status: Abnormal   Collection Time: 06/26/15  1:50 AM  Result Value Ref Range   Tricyclic, Ur Screen POSITIVE (A) NONE DETECTED   Amphetamines, Ur Screen NONE DETECTED NONE DETECTED   MDMA (Ecstasy)Ur Screen NONE DETECTED NONE DETECTED   Cocaine Metabolite,Ur [place] NONE DETECTED NONE DETECTED   Opiate, Ur Screen POSITIVE (A) NONE DETECTED   Phencyclidine (PCP) Ur S NONE DETECTED NONE DETECTED   Cannabinoid 50 Ng, Ur [place] NONE DETECTED NONE DETECTED   Barbiturates, Ur Screen NONE DETECTED NONE DETECTED   Benzodiazepine, Ur Scrn NONE DETECTED NONE DETECTED   Methadone Scn, Ur NONE DETECTED NONE DETECTED    Comment: (NOTE) 407  Tricyclics, urine               Cutoff 1000  ng/mL 200  Amphetamines, urine             Cutoff 1000 ng/mL 300  MDMA (Ecstasy), urine  Cutoff 500 ng/mL 400  Cocaine Metabolite, urine       Cutoff 300 ng/mL 500  Opiate, urine                   Cutoff 300 ng/mL 600  Phencyclidine (PCP), urine      Cutoff 25 ng/mL 700  Cannabinoid, urine              Cutoff 50 ng/mL 800  Barbiturates, urine             Cutoff 200 ng/mL 900  Benzodiazepine, urine           Cutoff 200 ng/mL 1000 Methadone, urine                Cutoff 300 ng/mL 1100 1200 The urine drug screen provides only a preliminary, unconfirmed 1300 analytical test result and should not be used for non-medical 1400 purposes. Clinical consideration and professional judgment should 1500 be applied to any positive drug screen result due to possible 1600 interfering substances. A more specific alternate chemical method 1700 must be used in order to obtain a confirmed analytical result.  1800 Gas chromato graphy / mass spectrometry (GC/MS) is the preferred 1900 confirmatory method.   Protime-INR     Status: Abnormal   Collection Time: 06/27/15 11:31 AM  Result Value Ref Range   Prothrombin Time 23.1 (H) 11.4 - 15.0 seconds   INR 2.06     Current Facility-Administered Medications  Medication Dose Route Frequency Provider Last Rate Last Dose  . amLODipine (NORVASC) tablet 5 mg  5 mg Oral Daily Lavonia Drafts, MD   5 mg at 06/27/15 1014  . calcium-vitamin D (OSCAL WITH D) 500-200 MG-UNIT per tablet 1 tablet  1 tablet Oral Rodney Booze Lavonia Drafts, MD   1 tablet at 06/27/15 1015  . cholecalciferol (VITAMIN D) tablet 5,000 Units  5,000 Units Oral Daily Lavonia Drafts, MD   5,000 Units at 06/27/15 1016  . HYDROmorphone (DILAUDID) tablet 4 mg  4 mg Oral Q6H PRN Lavonia Drafts, MD   4 mg at 06/27/15 1133  . methimazole (TAPAZOLE) tablet 7.5 mg  7.5 mg Oral Daily Lavonia Drafts, MD   7.5 mg at 06/27/15 1015  . metoprolol succinate (TOPROL-XL) 24 hr tablet 50 mg  50 mg Oral Gracy Bruins, MD   50 mg at 06/27/15 1016  . pantoprazole (PROTONIX) EC tablet 40 mg  40 mg Oral Daily Lavonia Drafts, MD   40 mg at 06/27/15 1133  . rOPINIRole (REQUIP) tablet 1 mg  1 mg Oral BID Lavonia Drafts, MD   1 mg at 06/27/15 1015  . senna-docusate (Senokot-S) tablet 1 tablet  1 tablet Oral TID Lavonia Drafts, MD   1 tablet at 06/27/15 1015  . spironolactone (ALDACTONE) tablet 50 mg  50 mg Oral Daily Lavonia Drafts, MD   50 mg at 06/27/15 1014  . traZODone (DESYREL) tablet 50 mg  50 mg Oral QHS Lavonia Drafts, MD   50 mg at 06/26/15 2105  . warfarin (COUMADIN) tablet 10 mg  10 mg Oral Weekly Lavonia Drafts, MD      . Derrill Memo ON 06/28/2015] warfarin (COUMADIN) tablet 2 mg  2 mg Oral Once per day on Sun Mon Tue Wed Thu Sat Lavonia Drafts, MD       And  . Derrill Memo ON 06/28/2015] warfarin (COUMADIN) tablet 5 mg  5 mg Oral Once per day on Sun Mon Tue Wed Thu Sat Lavonia Drafts, MD      .  Warfarin - Physician Dosing Inpatient   Does not apply q1800 Lavonia Drafts, MD       Current Outpatient Prescriptions  Medication Sig Dispense Refill  . amLODipine (NORVASC) 5 MG tablet Take 5 mg by mouth daily.    . bethanechol (URECHOLINE) 25 MG tablet Take 25 mg by mouth as needed.    . Biotin 5000 MCG TABS Take 5,000 mcg by mouth every morning.    . Calcium Carb-Cholecalciferol (CALCIUM 600 + D) 600-200 MG-UNIT TABS Take 1 tablet by mouth every morning.    . Cholecalciferol (VITAMIN D3) 5000 units CAPS Take 5,000 Units by mouth daily.    . ferrous sulfate (SLOW FE) 160 (50 Fe) MG TBCR SR tablet Take 1 tablet by mouth 2 (two) times daily.    Marland Kitchen HYDROmorphone (DILAUDID) 4 MG tablet Take 4 mg by mouth every 6 (six) hours as needed for moderate pain or severe pain.    Marland Kitchen LACTOBACILLUS PO Take 1 tablet by mouth daily.    . Lidocaine, Anorectal, 5 % CREA Apply topically as needed.    Marland Kitchen Lysine 1000 MG TABS Take 1,000 mg by mouth 2 (two) times daily.    . Magnesium 250 MG TABS Take 250 mg by mouth 2 (two) times daily.    .  Melatonin 5 MG TABS Take 5 mg by mouth at bedtime.    . methimazole (TAPAZOLE) 5 MG tablet Take 7.5 mg by mouth daily.    . metoprolol succinate (TOPROL-XL) 50 MG 24 hr tablet Take 50 mg by mouth every morning. Take with or immediately following a meal.    . Multiple Vitamins-Minerals (VISION-VITE PRESERVE PO) Take 1 tablet by mouth 2 (two) times daily.    Marland Kitchen omeprazole (PRILOSEC) 20 MG capsule Take 20 mg by mouth daily.    . predniSONE (DELTASONE) 10 MG tablet Take 10 mg by mouth daily with breakfast.    . rOPINIRole (REQUIP) 1 MG tablet Take 1 mg by mouth 2 (two) times daily.    Marland Kitchen senna-docusate (SENOKOT-S) 8.6-50 MG tablet Take 1 tablet by mouth 3 (three) times daily.    Marland Kitchen spironolactone (ALDACTONE) 50 MG tablet Take 50 mg by mouth daily.    . traZODone (DESYREL) 50 MG tablet Take 50 mg by mouth at bedtime.    . triamcinolone cream (KENALOG) 0.1 % Apply topically as needed. For redness on legs    . vitamin B-12 (CYANOCOBALAMIN) 1000 MCG tablet Take 2,500 mcg by mouth daily.    Marland Kitchen warfarin (COUMADIN) 10 MG tablet Take 10 mg by mouth once a week. Take on Friday    . warfarin (COUMADIN) 2 MG tablet Take 2 mg by mouth daily. Take in addition to 48m for a total of 745mdaily on Sunday, Monday, Tuesday, Wednesday, Thursday, and Saturday    . warfarin (COUMADIN) 5 MG tablet Take 5 mg by mouth daily. Take in addition with 83m38mor a total of 7mg9mily on Sunday, Monday, Tuesday, Wednesday, Thursday, and Saturday      Musculoskeletal: Strength & Muscle Tone: decreased Gait & Station: unsteady Patient leans: N/A  Psychiatric Specialty Exam: Review of Systems  Constitutional: Negative.   HENT: Negative.   Eyes: Negative.   Respiratory: Negative.   Cardiovascular: Negative.   Gastrointestinal: Negative.   Musculoskeletal: Positive for back pain and joint pain.  Skin: Negative.   Neurological: Negative.   Psychiatric/Behavioral: Positive for memory loss. Negative for depression, suicidal ideas,  hallucinations and substance abuse. The patient is nervous/anxious and has  insomnia.     Blood pressure 126/69, pulse 102, temperature 98.2 F (36.8 C), temperature source Oral, resp. rate 18, height 5' 3"  (1.6 m), weight 81.647 kg (180 lb), SpO2 100 %.Body mass index is 31.89 kg/(m^2).  General Appearance: Disheveled  Eye Contact::  Fair  Speech:  Garbled  Volume:  Decreased  Mood:  Irritable  Affect:  Constricted  Thought Process:  Tangential  Orientation:  Other:  To my surprise the patient could not tell me where she was at all nor could she give me any answers to the time.  Thought Content:  Paranoid Ideation  Suicidal Thoughts:  No  Homicidal Thoughts:  Patient is denying this although it is what his alleged in the commitment paperwork  Memory:  Immediate;   Good Recent;   Poor Remote;   Poor  Judgement:  Impaired  Insight:  Lacking  Psychomotor Activity:  Decreased  Concentration:  Fair  Recall:  Poor  Fund of Knowledge:Fair  Language: Fair  Akathisia:  No  Handed:  Right  AIMS (if indicated):     Assets:  Housing Social Support  ADL's:  Impaired  Cognition: Impaired,  Mild and Moderate  Sleep:      Treatment Plan Summary: Daily contact with patient to assess and evaluate symptoms and progress in treatment, Medication management and Plan Supportive counseling reviewed plan with patient who really I don't think understands and certainly doesn't want to be in the hospital but continues to express psychotic thoughts towards her husband. No change to medication for today. Chest x-ray and EKG completed and good enough for transfer to [facility]. Patient will be transferred under IVC as soon as arrangements can be made for further treatment on an inpatient geriatric ward.  Disposition: Recommend psychiatric Inpatient admission when medically cleared. Supportive therapy provided about ongoing stressors.  Alethia Berthold, MD 06/27/2015 4:09 PM

## 2015-06-27 NOTE — Progress Notes (Signed)
[facility] to [facility]. Spoke with [facility] [place] and they require chest exray and EKG report and they will likely accept patient. TTS notified.  Delta Air LinesClaudine Ceonna Frazzini LCSW [phone]

## 2015-06-27 NOTE — ED Notes (Signed)
Patient transported to X-ray 

## 2015-09-11 ENCOUNTER — Emergency Department (HOSPITAL_COMMUNITY): Payer: Medicare Other

## 2015-09-11 ENCOUNTER — Encounter (HOSPITAL_COMMUNITY): Payer: Self-pay | Admitting: Emergency Medicine

## 2015-09-11 ENCOUNTER — Emergency Department (HOSPITAL_COMMUNITY)
Admission: EM | Admit: 2015-09-11 | Discharge: 2015-09-11 | Disposition: A | Discharge status: Home / Self Care | Payer: Medicare Other | Attending: Emergency Medicine | Admitting: Emergency Medicine

## 2015-09-11 DIAGNOSIS — Z7901 Long term (current) use of anticoagulants: Secondary | ICD-10-CM | POA: Insufficient documentation

## 2015-09-11 DIAGNOSIS — F0390 Unspecified dementia without behavioral disturbance: Secondary | ICD-10-CM | POA: Diagnosis not present

## 2015-09-11 DIAGNOSIS — Z79899 Other long term (current) drug therapy: Secondary | ICD-10-CM | POA: Insufficient documentation

## 2015-09-11 DIAGNOSIS — R531 Weakness: Secondary | ICD-10-CM | POA: Insufficient documentation

## 2015-09-11 HISTORY — DX: Unspecified dementia, unspecified severity, without behavioral disturbance, psychotic disturbance, mood disturbance, and anxiety: F03.90

## 2015-09-11 LAB — COMPREHENSIVE METABOLIC PANEL
ALBUMIN: 3.4 g/dL — AB (ref 3.5–5.0)
ALK PHOS: 73 U/L (ref 38–126)
ALT: 12 U/L — AB (ref 14–54)
AST: 17 U/L (ref 15–41)
Anion gap: 7 (ref 5–15)
BILIRUBIN TOTAL: 0.3 mg/dL (ref 0.3–1.2)
BUN: 36 mg/dL — AB (ref 6–20)
CO2: 26 mmol/L (ref 22–32)
CREATININE: 1.17 mg/dL — AB (ref 0.44–1.00)
Calcium: 9.3 mg/dL (ref 8.9–10.3)
Chloride: 102 mmol/L (ref 101–111)
GFR calc Af Amer: 51 mL/min — ABNORMAL LOW (ref >60)
GFR, EST NON AFRICAN AMERICAN: 44 mL/min — AB (ref >60)
GLUCOSE: 90 mg/dL (ref 65–99)
POTASSIUM: 4.7 mmol/L (ref 3.5–5.1)
Sodium: 135 mmol/L (ref 135–145)
TOTAL PROTEIN: 7.5 g/dL (ref 6.5–8.1)

## 2015-09-11 LAB — URINALYSIS, ROUTINE W REFLEX MICROSCOPIC
BILIRUBIN URINE: NEGATIVE
GLUCOSE, UA: NEGATIVE mg/dL
Hgb urine dipstick: NEGATIVE
KETONES UR: NEGATIVE mg/dL
Leukocytes, UA: NEGATIVE
NITRITE: NEGATIVE
PH: 5.5 (ref 5.0–8.0)
Protein, ur: NEGATIVE mg/dL
SPECIFIC GRAVITY, URINE: 1.02 (ref 1.005–1.030)

## 2015-09-11 LAB — CBC WITH DIFFERENTIAL/PLATELET
BASOS ABS: 0 10*3/uL (ref 0.0–0.1)
BASOS PCT: 0 %
Eosinophils Absolute: 0.2 10*3/uL (ref 0.0–0.7)
Eosinophils Relative: 2 %
HEMATOCRIT: 32.8 % — AB (ref 36.0–46.0)
HEMOGLOBIN: 10.8 g/dL — AB (ref 12.0–15.0)
LYMPHS PCT: 21 %
Lymphs Abs: 1.7 10*3/uL (ref 0.7–4.0)
MCH: 29.4 pg (ref 26.0–34.0)
MCHC: 32.9 g/dL (ref 30.0–36.0)
MCV: 89.4 fL (ref 78.0–100.0)
MONO ABS: 1 10*3/uL (ref 0.1–1.0)
MONOS PCT: 12 %
NEUTROS ABS: 5.4 10*3/uL (ref 1.7–7.7)
NEUTROS PCT: 65 %
Platelets: 331 10*3/uL (ref 150–400)
RBC: 3.67 MIL/uL — AB (ref 3.87–5.11)
RDW: 14.3 % (ref 11.5–15.5)
WBC: 8.2 10*3/uL (ref 4.0–10.5)

## 2015-09-11 LAB — PROTIME-INR
INR: 2.96
Prothrombin Time: 31.5 seconds — ABNORMAL HIGH (ref 11.4–15.2)

## 2015-09-11 LAB — I-STAT TROPONIN, ED: TROPONIN I, POC: 0.01 ng/mL (ref 0.00–0.08)

## 2015-09-11 NOTE — Discharge Instructions (Signed)
Follow-up with her doctor next week for recheck. Return if problems

## 2015-09-15 NOTE — ED Provider Notes (Signed)
[facility]-EMERGENCY DEPT Provider Note   CSN: [id number] Arrival date & time: 09/11/15  1631  First Provider Contact:  First MD Initiated Contact with Patient 09/11/15 1705        History   Chief Complaint Chief Complaint  Patient presents with  . Weakness    generalized weakness for approx 1 month. states legs are worse. endorses difficulty standing. per EMS, they had to assist pt in standing and ambulating.     HPI Kristin Roy is a 75 y.o. female.  The family states they brought Kristin Roy to the hospital because she complains of weakness in her legs   The history is provided by the patient. No language interpreter was used.  Weakness  This is a new problem. The current episode started more than 2 days ago. The problem occurs constantly. The problem has not changed since onset.Pertinent negatives include no chest pain, no abdominal pain and no headaches. Nothing aggravates the symptoms. Nothing relieves the symptoms. She has tried nothing for the symptoms.    Past Medical History:  Diagnosis Date  . Dementia     Patient Active Problem List   Diagnosis Date Noted  . 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 Threatening to others 06/26/2015  . Chronic pain 06/26/2015    No past surgical history on file.  OB History    No data available       Home Medications    Prior to Admission medications   Medication Sig Start Date End Date Taking? Authorizing Provider  amLODipine (NORVASC) 5 MG tablet Take 5 mg by mouth daily.   Yes Historical Provider, MD  calcium carbonate (OS-CAL - DOSED IN MG OF ELEMENTAL CALCIUM) 1250 (500 Ca) MG tablet Take 1 tablet by mouth daily with breakfast.   Yes Historical Provider, MD  cholecalciferol (VITAMIN D) 1000 units tablet Take 1,000 Units by mouth daily.   Yes Historical Provider, MD  folic acid (FOLVITE) 1 MG tablet Take 1 mg by mouth daily.   Yes Historical Provider, MD  Melatonin 1 MG TABS Take 2 mg by mouth at bedtime.   Yes Historical  Provider, MD  methimazole (TAPAZOLE) 5 MG tablet Take 7.5 mg by mouth daily.   Yes Historical Provider, MD  metoprolol succinate (TOPROL-XL) 50 MG 24 hr tablet Take 50 mg by mouth every morning. Take with or immediately following a meal.   Yes Historical Provider, MD  Multiple Vitamins-Minerals (PRESERVISION AREDS 2 PO) Take 1 capsule by mouth 2 (two) times daily.   Yes Historical Provider, MD  OLANZapine (ZYPREXA) 10 MG tablet Take 10 mg by mouth at bedtime.   Yes Historical Provider, MD  omeprazole (PRILOSEC) 20 MG capsule Take 20 mg by mouth daily.   Yes Historical Provider, MD  rOPINIRole (REQUIP) 1 MG tablet Take 1 mg by mouth daily.    Yes Historical Provider, MD  senna-docusate (SENOKOT-S) 8.6-50 MG tablet Take 1 tablet by mouth 3 (three) times daily.   Yes Historical Provider, MD  spironolactone (ALDACTONE) 50 MG tablet Take 50 mg by mouth daily.   Yes Historical Provider, MD  traZODone (DESYREL) 100 MG tablet Take 200 mg by mouth at bedtime.   Yes Historical Provider, MD  warfarin (COUMADIN) 7.5 MG tablet Take 7.5 mg by mouth daily.   Yes Historical Provider, MD    Family History No family history on file.  Social History Social History  Substance Use Topics  . Smoking status: Never Smoker  . Smokeless tobacco: Never Used  .  Alcohol use No     Allergies   Cephalosporins; Hydroxychloroquine; Methotrexate; Codeine; Doxycycline; Pregabalin; Donepezil; Other; and Sulfamethoxazole-trimethoprim   Review of Systems Review of Systems  Constitutional: Negative for appetite change and fatigue.  HENT: Negative for congestion, ear discharge and sinus pressure.   Eyes: Negative for discharge.  Respiratory: Negative for cough.   Cardiovascular: Negative for chest pain.  Gastrointestinal: Negative for abdominal pain and diarrhea.  Genitourinary: Negative for frequency and hematuria.  Musculoskeletal: Negative for back pain.       Bilateral leg weakness  Skin: Negative for rash.    Neurological: Positive for weakness. Negative for seizures and headaches.  Psychiatric/Behavioral: Negative for hallucinations.     Physical Exam Updated Vital Signs BP 150/66   Pulse 78   Temp 97.4 F (36.3 C) (Oral)   Resp 17   Ht  (1.6 m)   Wt 200 lb (90.7 kg)   SpO2 96%   BMI 35.43 kg/m   Physical Exam  Constitutional: She is oriented to person, place, and time. She appears well-developed.  HENT:  Head: Normocephalic.  Eyes: Conjunctivae and EOM are normal. No scleral icterus.  Neck: Neck supple. No thyromegaly present.  Cardiovascular: Normal rate and regular rhythm.  Exam reveals no gallop and no friction rub.   No murmur heard. Pulmonary/Chest: No stridor. She has no wheezes. She has no rales. She exhibits no tenderness.  Abdominal: She exhibits no distension. There is no tenderness. There is no rebound.  Musculoskeletal: Normal range of motion. She exhibits no edema.  Patient having bilateral leg weakness. It is difficult for the patient to stand.  Lymphadenopathy:    She has no cervical adenopathy.  Neurological: She is oriented to person, place, and time. She exhibits normal muscle tone. Coordination normal.  Skin: No rash noted. No erythema.  Psychiatric: She has a normal mood and affect. Her behavior is normal.     ED Treatments / Results  Labs (all labs ordered are listed, but only abnormal results are displayed) Labs Reviewed  CBC WITH DIFFERENTIAL/PLATELET - Abnormal; Notable for the following:       Result Value   RBC 3.67 (*)    Hemoglobin 10.8 (*)    HCT 32.8 (*)    All other components within normal limits  COMPREHENSIVE METABOLIC PANEL - Abnormal; Notable for the following:    BUN 36 (*)    Creatinine, Ser 1.17 (*)    Albumin 3.4 (*)    ALT 12 (*)    GFR calc non Af Amer 44 (*)    GFR calc Af Amer 51 (*)    All other components within normal limits  PROTIME-INR - Abnormal; Notable for the following:    Prothrombin Time 31.5 (*)     All other components within normal limits  URINALYSIS, ROUTINE W REFLEX MICROSCOPIC (NOT AT [facility])  I-STAT TROPOININ, ED    EKG  EKG Interpretation None       Radiology No results found.  Procedures Procedures (including critical care time)  Medications Ordered in ED Medications - No data to display   Initial Impression / Assessment and Plan / ED Course  I have reviewed the triage vital signs and the nursing notes.  Pertinent labs & imaging results that were available during my care of the patient were reviewed by me and considered in my medical decision making (see chart for details).  Clinical Course    Labs were unremarkable CT scan of the head and neck along  with chest x-ray unremarkable. Patient was offered observation admission for weakness in her extremities. She did not want to stay in the hospital  Final Clinical Impressions(s) / ED Diagnoses   Final diagnoses:  Weakness    New Prescriptions Discharge Medication List as of 09/11/2015  9:17 PM       Bethann Berkshire, MD 09/15/15 2325

## 2017-03-07 IMAGING — DX DG CHEST 2V
2 series · 3 of 3 positions shown · non-contrast
Comparison: Chest x-ray dated 06/27/2015.

CLINICAL DATA: Generalized weakness for about a week, shortness of
breath with exertion.

EXAM:
CHEST  2 VIEW

[Series 2: chest lat · 0.14mm/px · 2 of 2 slices shown]
[im 1/2]
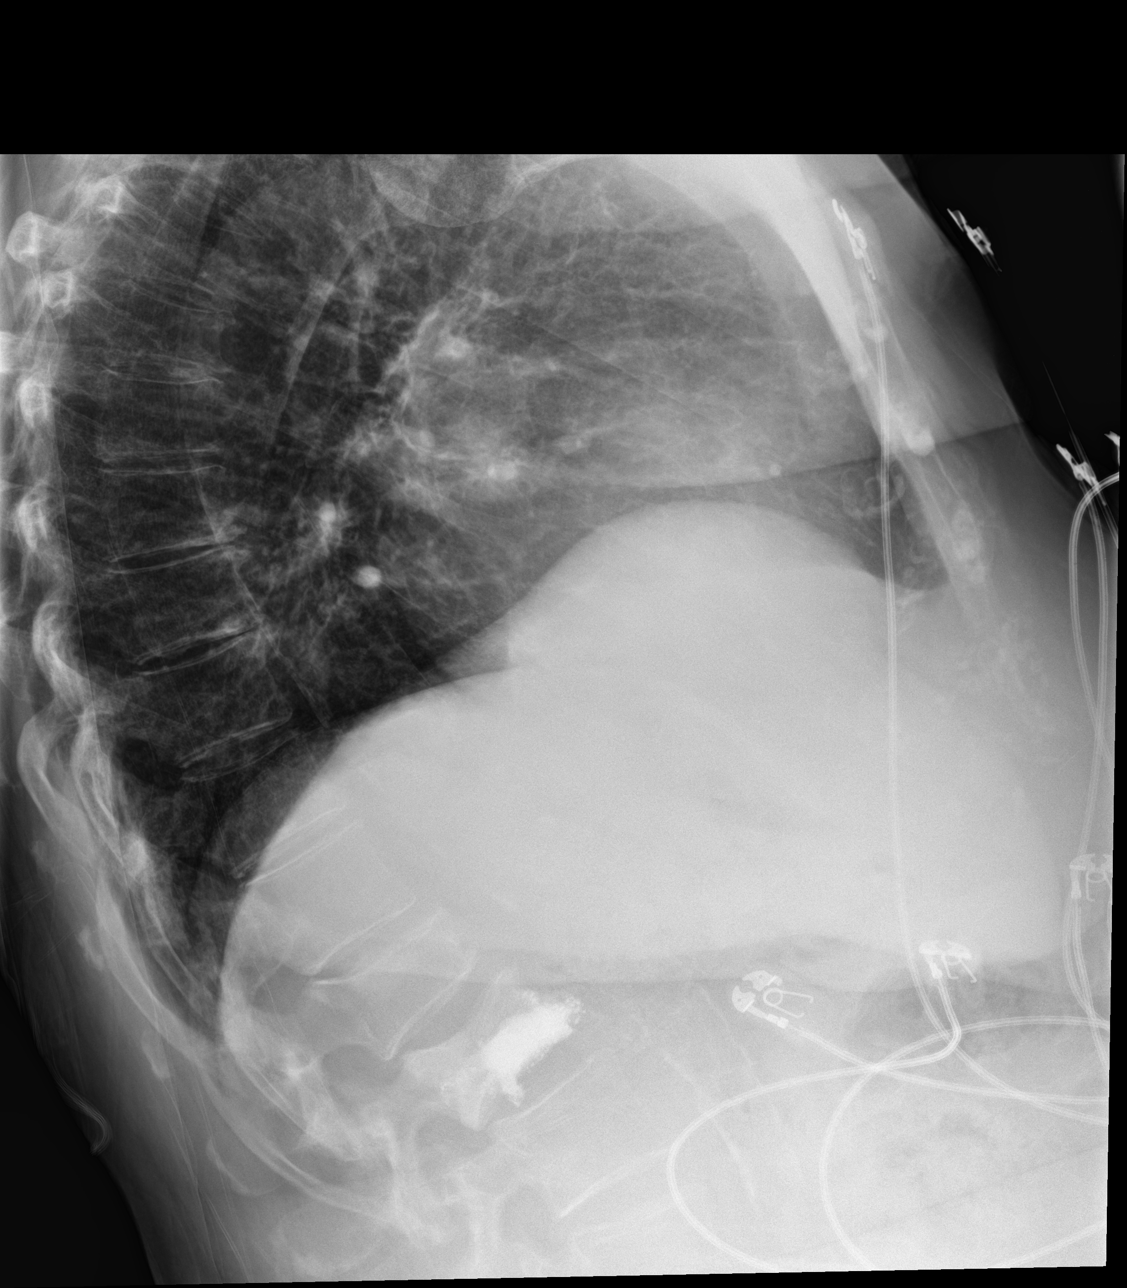
[im 2/2]
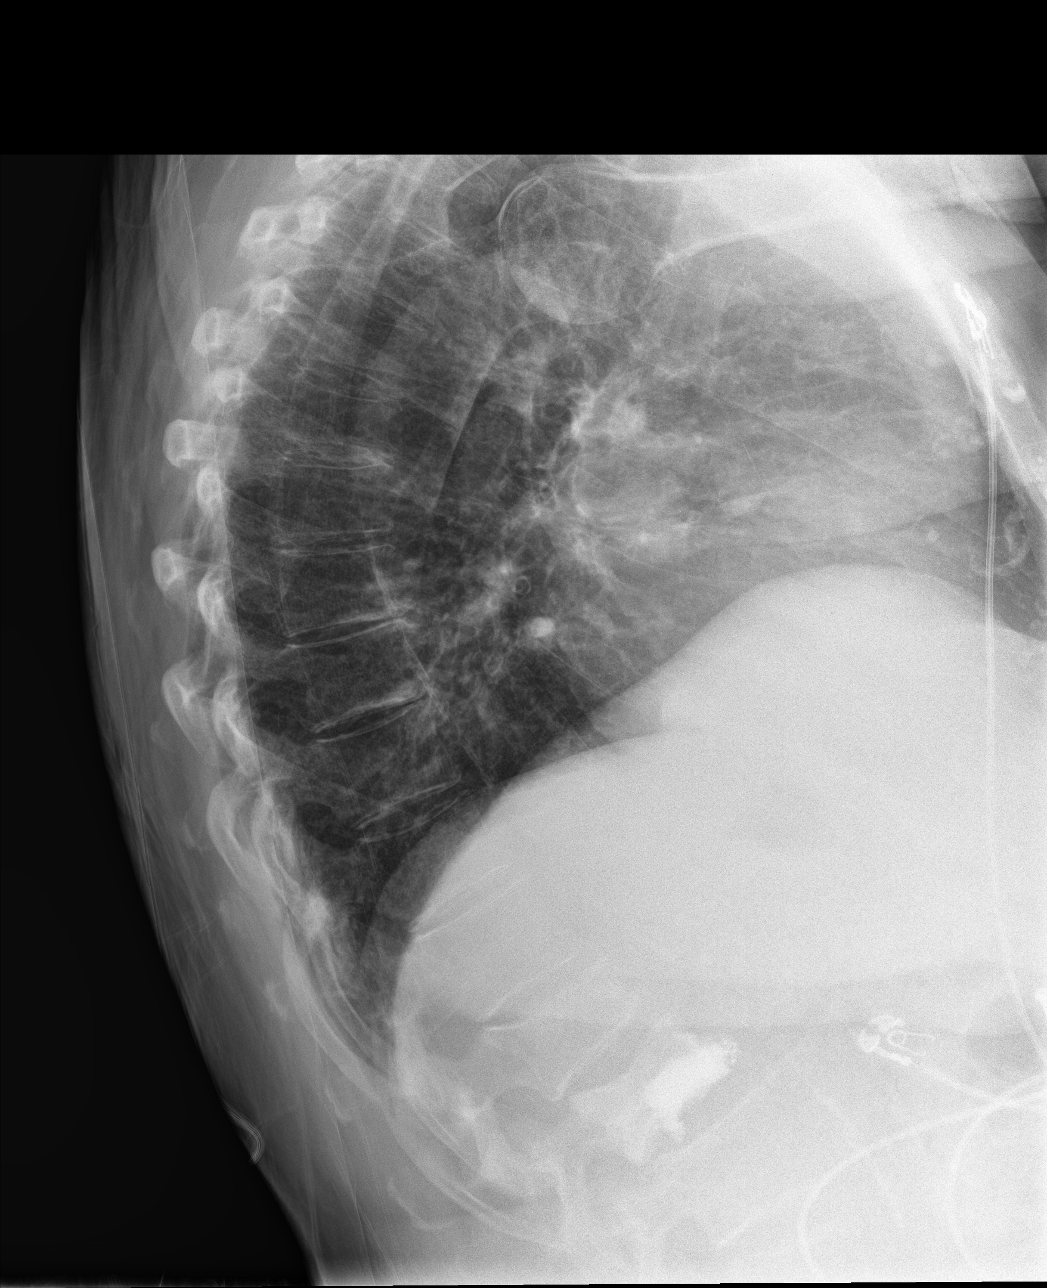

[chest ap]
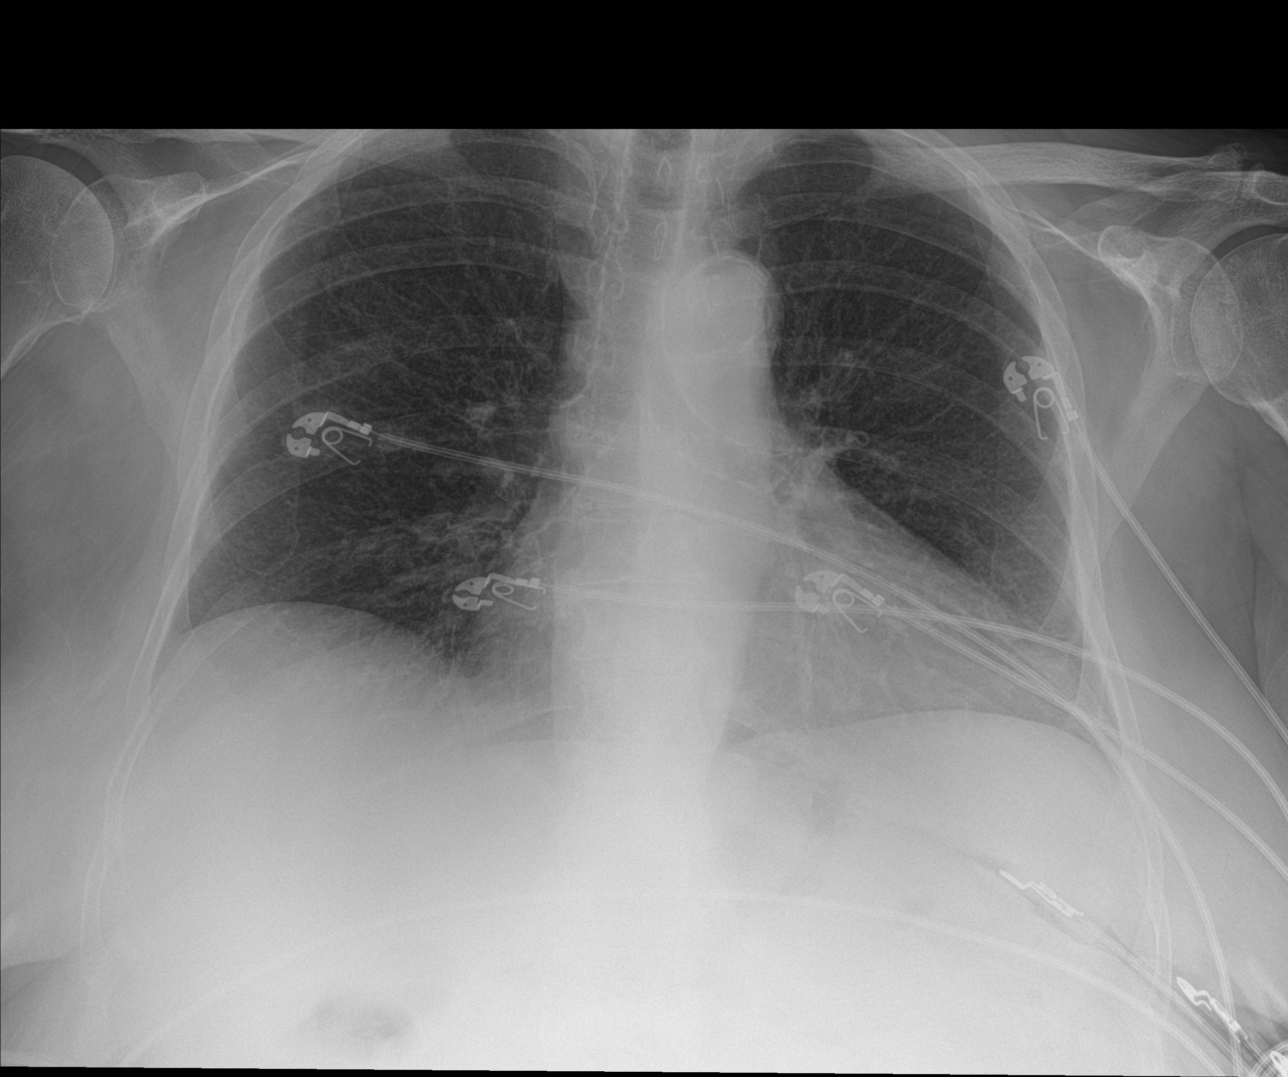

[3 of 3 positions shown; findings below may reference images not displayed]

FINDINGS: Study is hypoinspiratory. Heart size is upper normal, stable.
Atherosclerotic changes noted at the aortic arch. Lungs are clear.
No evidence of pneumonia. No pleural effusion or pneumothorax seen.

Chronic compression fracture deformities again noted at the
thoracolumbar junction, 1 of which is status post vertebroplasty. No
evidence of acute osseous abnormality.
IMPRESSION: 1. Lungs are clear and there is no evidence of acute cardiopulmonary
abnormality.
2. Aortic atherosclerosis.

## 2017-03-07 IMAGING — CT CT CERVICAL SPINE W/O CM
4 of 6 series · 15 of 33 positions shown, 16 images · non-contrast
Comparison: None.

CLINICAL DATA: Pt. Is having weakness back pain and lower extremity
pain x 30 days. Per pt.s RN pt. Is also having neck pain x 1 month
with no known injury.

EXAM:
CT HEAD WITHOUT CONTRAST
CT CERVICAL SPINE WITHOUT CONTRAST
TECHNIQUE: Multidetector CT imaging of the head and cervical spine was
performed following the standard protocol without intravenous
contrast. Multiplanar CT image reconstructions of the cervical spine
were also generated.

[Series 3: head bone · axial · 0.43mm/px · z∈[+214,+304]mm · 4 of 76 slices shown]
[im 16/76  bone]
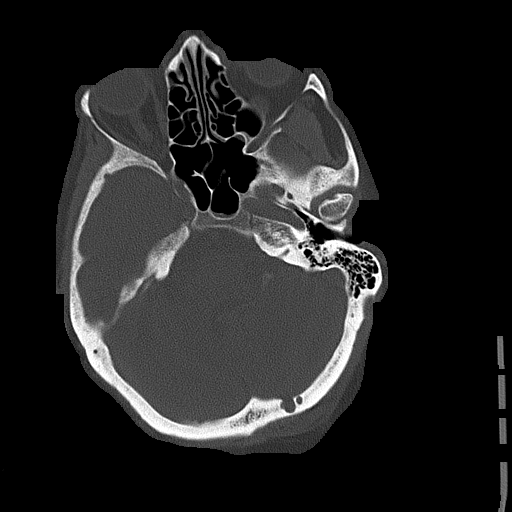
[im 31/76  bone]
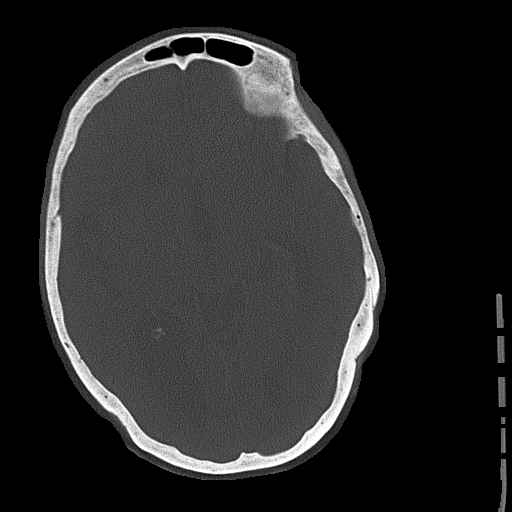
[im 46/76  bone]
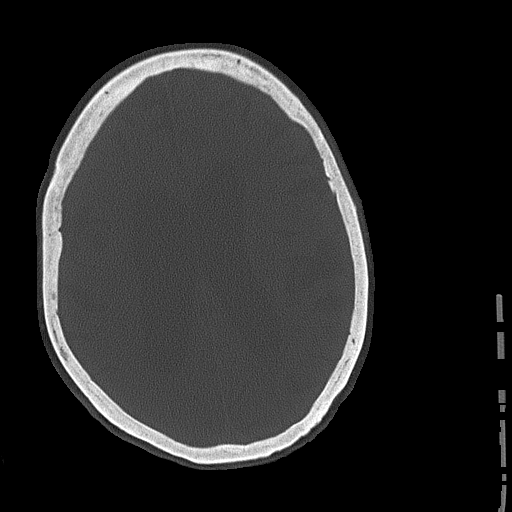
[im 61/76  bone]
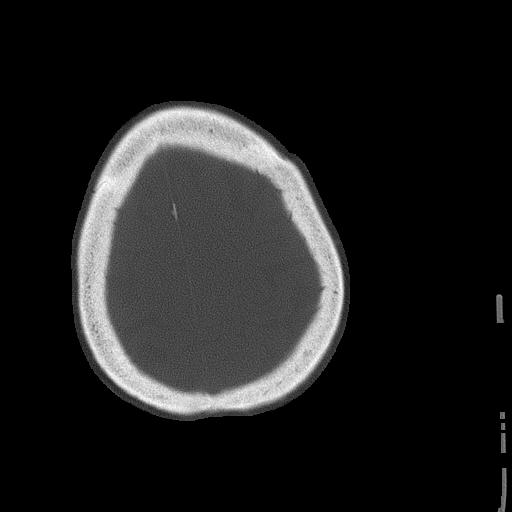

[Series 4: coronal · coronal · 0.30mm/px · 3 of 67 slices shown]
[im 14/67  bone]
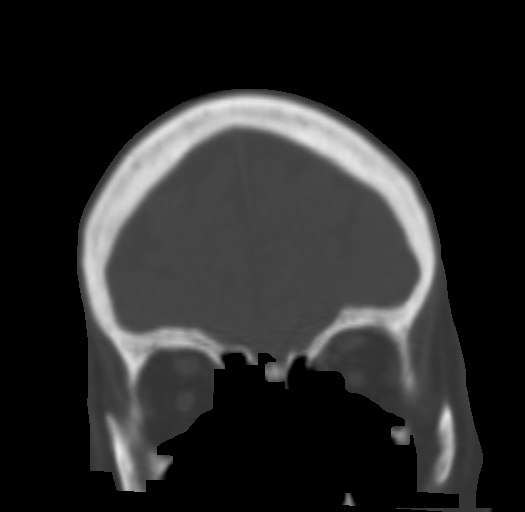
[im 27/67  bone]
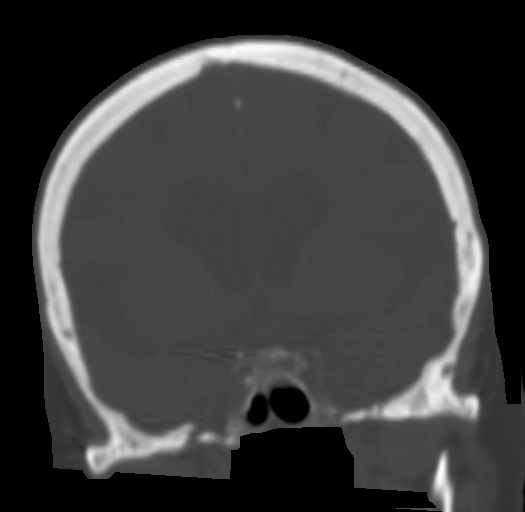
[im 40/67  bone]
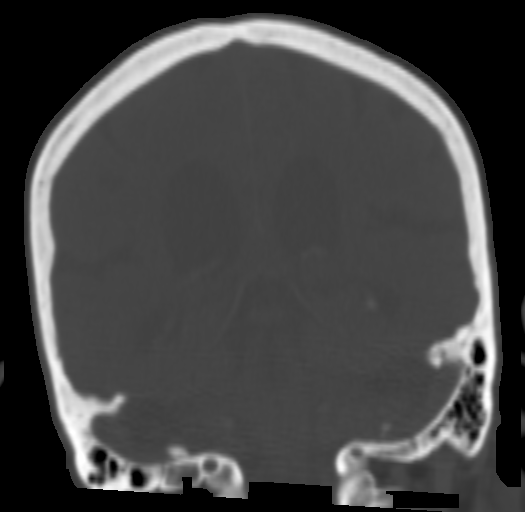

[Series 7: c spine soft · axial · 0.38mm/px · z∈[+118,+182]mm · 3 of 64 slices shown, 4 images]
[im 16/64  soft-tissue]
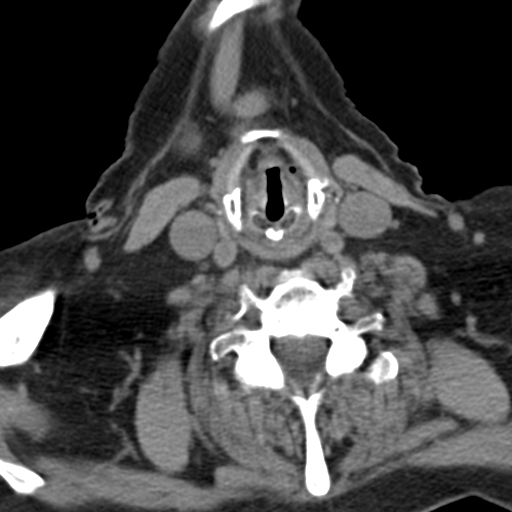
[im 16/64  bone]
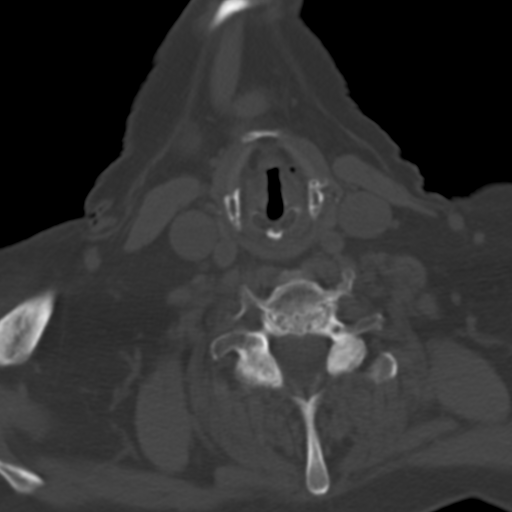
[im 32/64  bone]
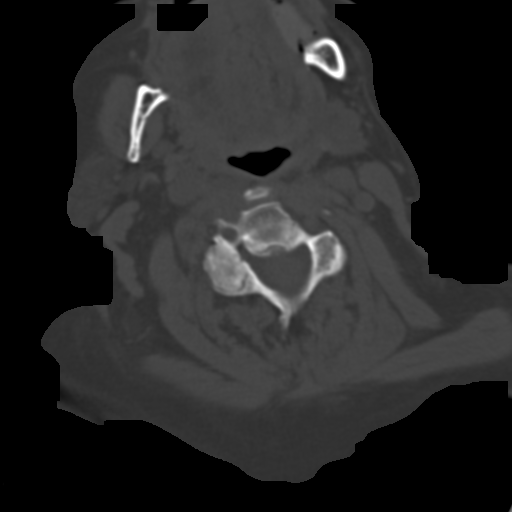
[im 48/64  bone]
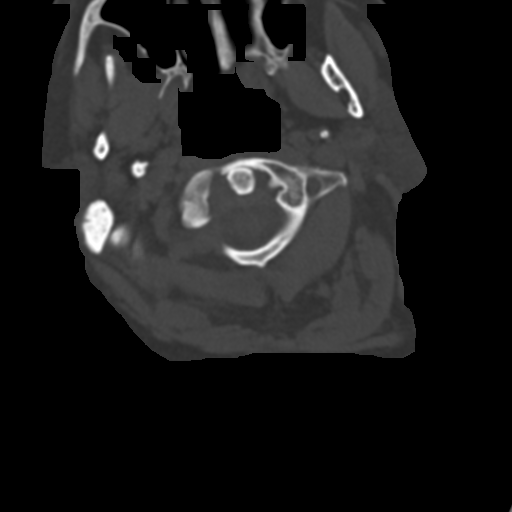

[Series 8: sagittal bone · sagittal · 0.24mm/px · 5 of 74 slices shown]
[im 11/74  bone]
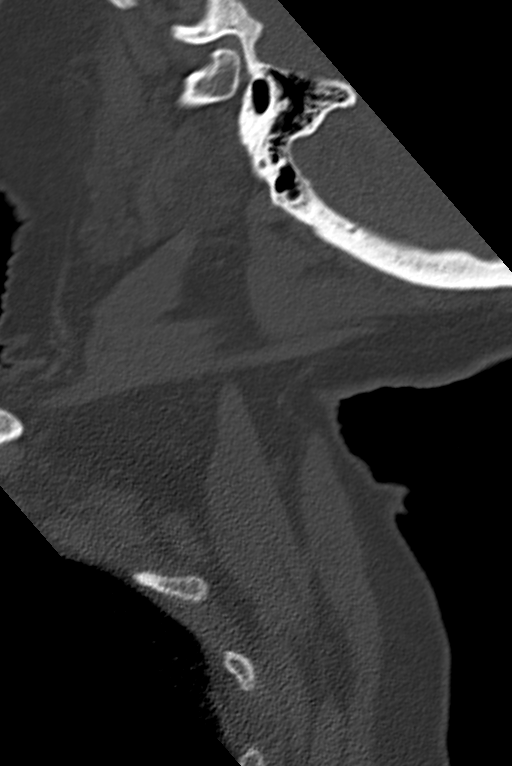
[im 21/74  bone]
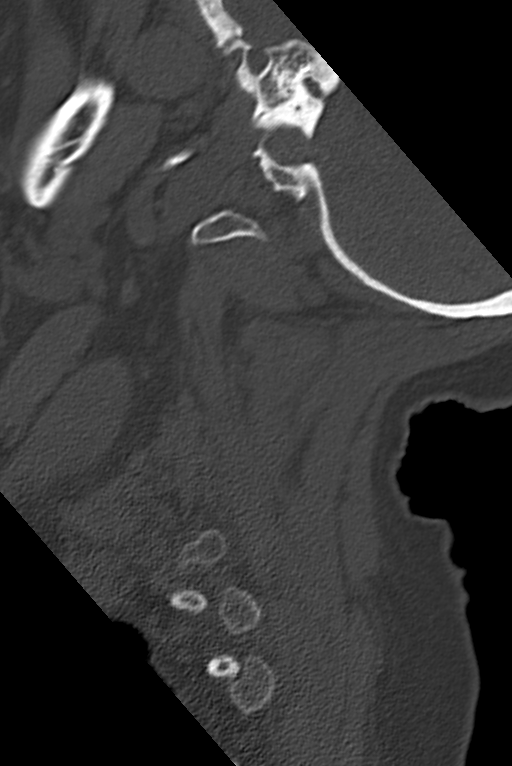
[im 32/74  bone]
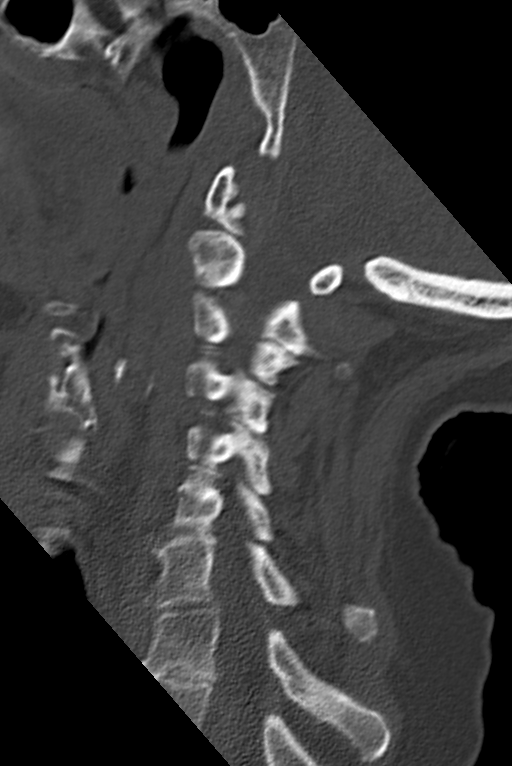
[im 42/74  bone]
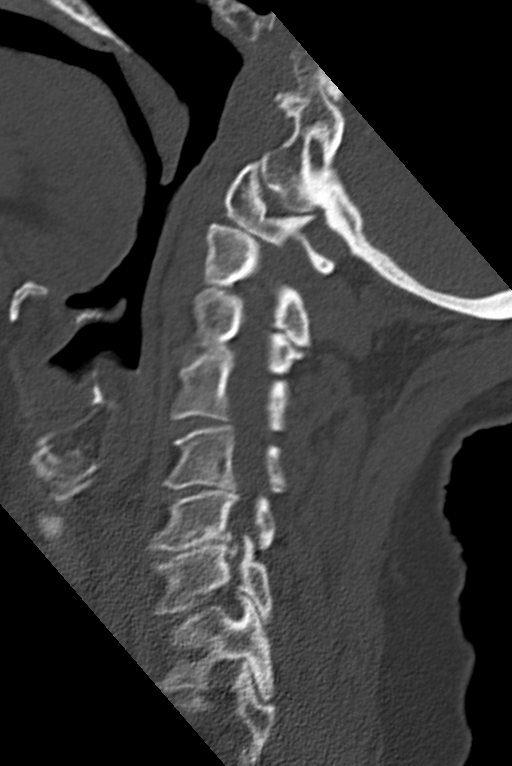
[im 53/74  bone]
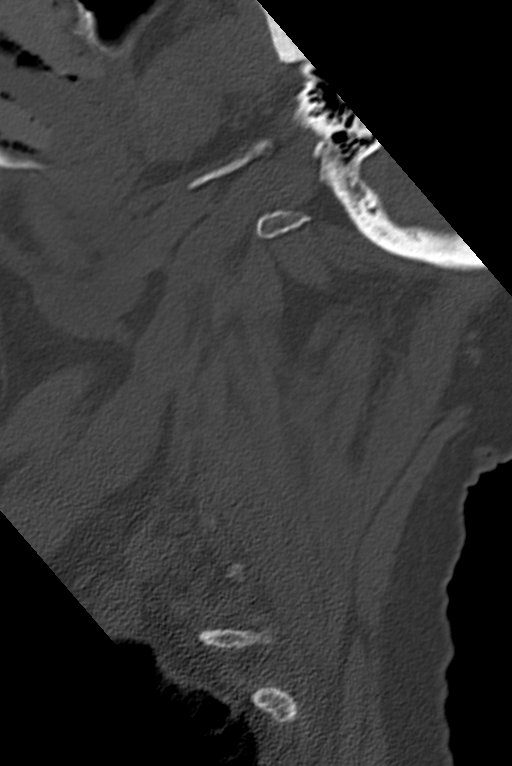

[15 of 33 positions shown; findings below may reference images not displayed]

FINDINGS: CT HEAD FINDINGS

The ventricles are normal in configuration. There is ventricular and
sulcal enlargement reflecting moderate generalized atrophy. No
hydrocephalus.

There are no parenchymal masses or mass effect. There is no evidence
of a cortical infarct. Mild periventricular white matter
hypoattenuation is noted consistent with chronic microvascular
ischemic change.

There are no extra-axial masses or abnormal fluid collections.

There is no intracranial hemorrhage.

The visualized sinuses and mastoid air cells are clear.

CT CERVICAL SPINE FINDINGS

No fracture. Grade 1 anterolisthesis of C2 on C3. No other
spondylolisthesis. There is loss of disc height throughout the
cervical spine, greatest at C6-C7 where it is moderate. There are
facet degenerative changes bilaterally most severe on the left at
C2-C3 than on the right from C2-C3 through C4-C5. There are varying
degrees of neural foraminal narrowing from facet and uncovertebral
spurring.

Soft tissues demonstrate carotid vascular calcifications but are
otherwise unremarkable. Lung apices are clear.
IMPRESSION: HEAD CT: No acute intracranial abnormalities. Moderate atrophy and
mild chronic microvascular ischemic change.

CERVICAL CT: No fracture or acute finding. There are degenerative
changes throughout the cervical spine, both disc and facet, as
described.

## 2019-03-19 DEATH — deceased
